# Patient Record
Sex: Female | Born: 1980 | Race: White | Hispanic: No | Marital: Married | Smoking: Current every day smoker
Health system: Southern US, Community
[De-identification: ages and names within clinical notes are randomized; demographics above are authoritative.]

## PROBLEM LIST (undated history)

## (undated) HISTORY — PX: DENTAL SURGERY: SHX609

---

## 2003-12-20 ENCOUNTER — Inpatient Hospital Stay: Payer: Self-pay | Admitting: Certified Nurse Midwife

## 2006-12-28 ENCOUNTER — Emergency Department: Payer: Self-pay | Admitting: Emergency Medicine

## 2007-07-08 ENCOUNTER — Inpatient Hospital Stay: Payer: Self-pay

## 2007-08-18 ENCOUNTER — Emergency Department: Payer: Self-pay | Admitting: Emergency Medicine

## 2008-12-14 ENCOUNTER — Emergency Department: Payer: Self-pay | Admitting: Emergency Medicine

## 2009-03-21 IMAGING — US US OB < 14 WEEKS - US OB TV
1 series · 17 of 28 positions shown · non-contrast
Comparison: none

REASON FOR EXAM: abd cramping, pain
COMMENTS:

PROCEDURE:     US  - US OB LESS THAN 14 WEEKS  - December 28, 2006 [DATE]
RESULT:     Comparison: No available comparison exam.

[Series 1: us ob < 14 weeks - us ob tv · 17 of 52 slices shown]
[im 1/52]
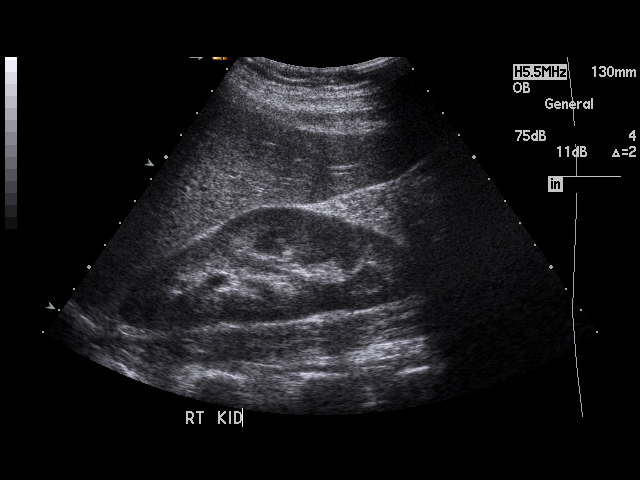
[im 4/52]
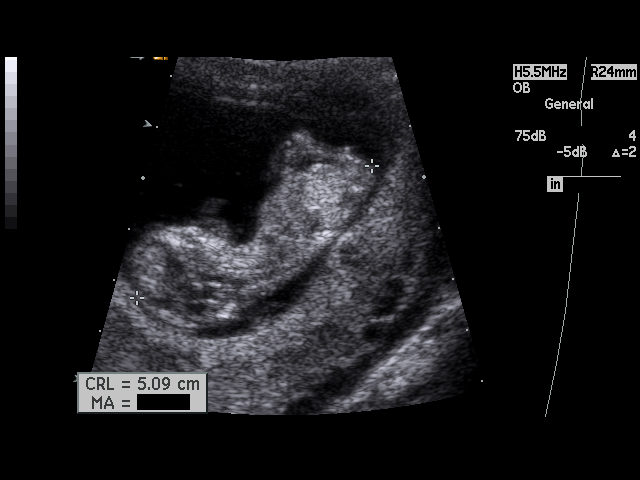
[im 8/52]
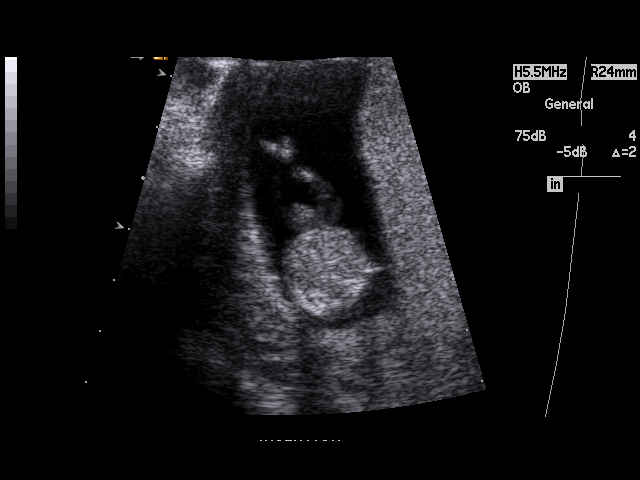
[im 10/52]
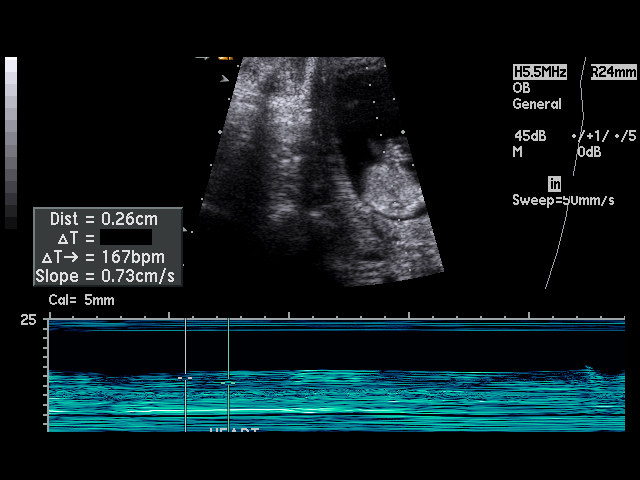
[im 14/52]
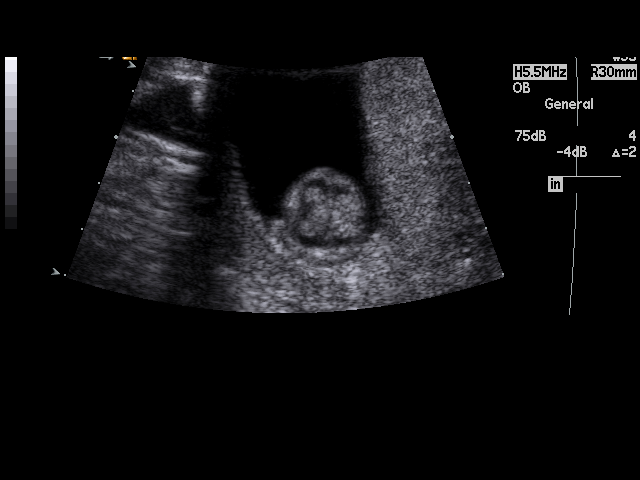
[im 18/52]
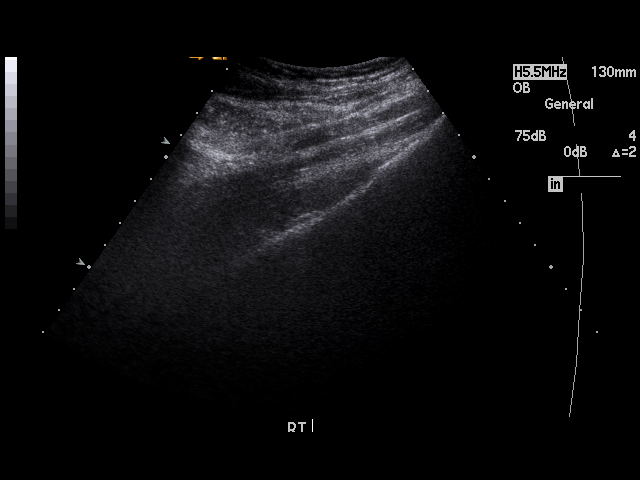
[im 19/52]
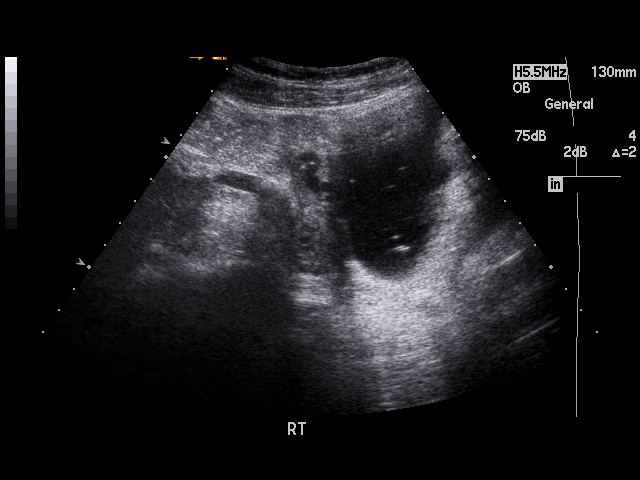
[im 23/52]
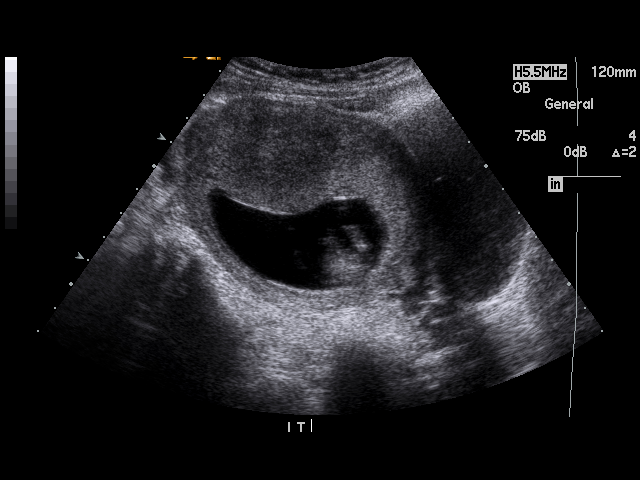
[im 27/52]
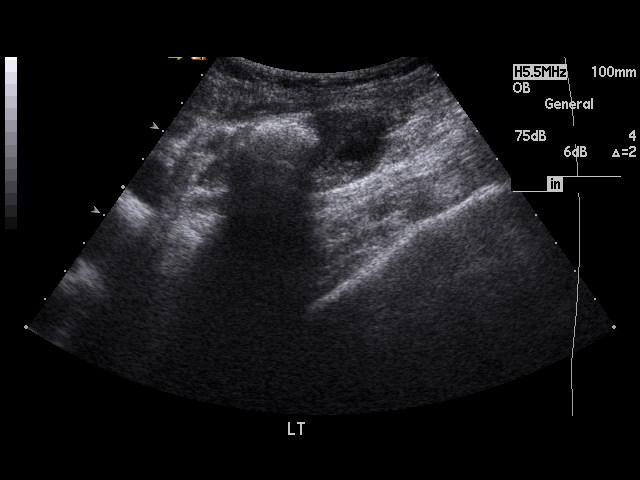
[im 29/52]
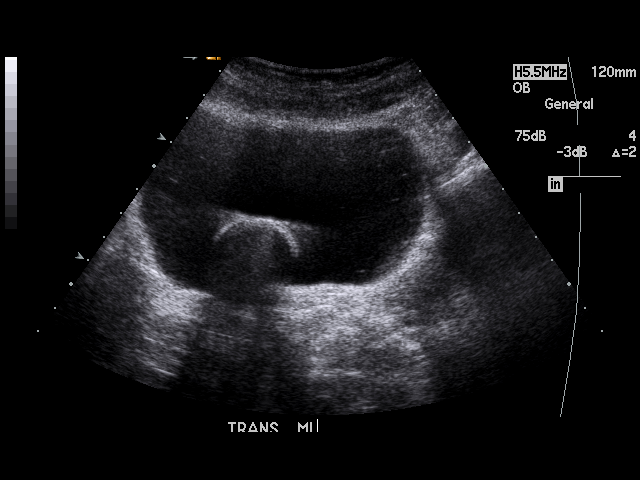
[im 33/52]
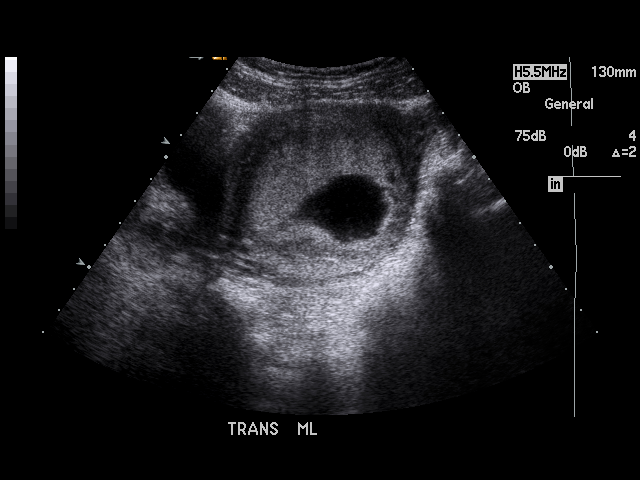
[im 35/52]
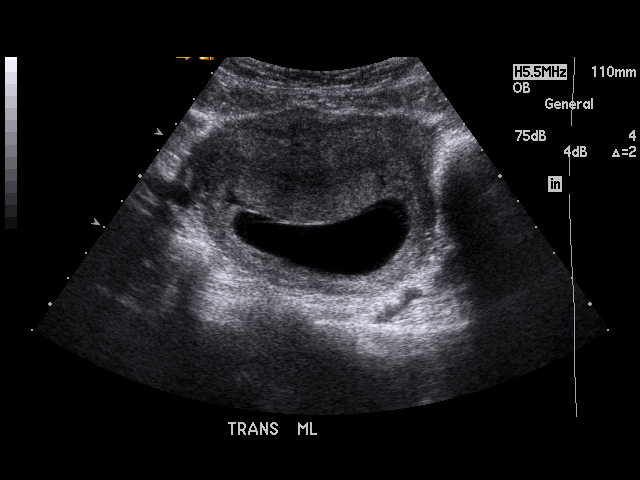
[im 38/52]
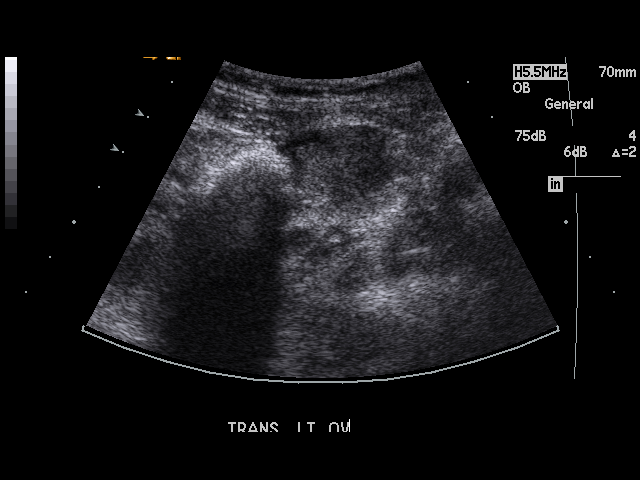
[im 42/52]
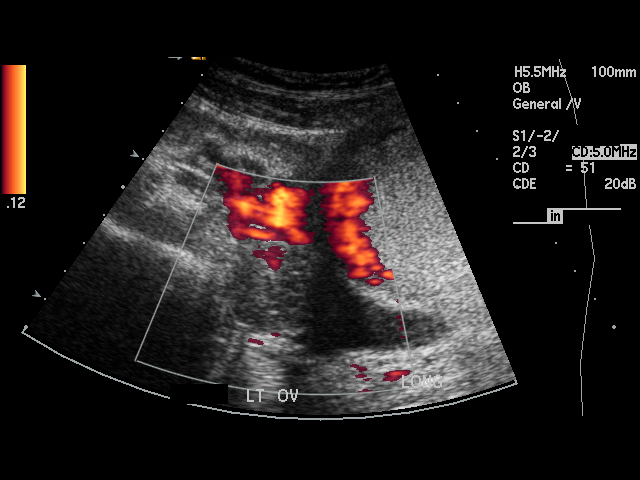
[im 44/52]
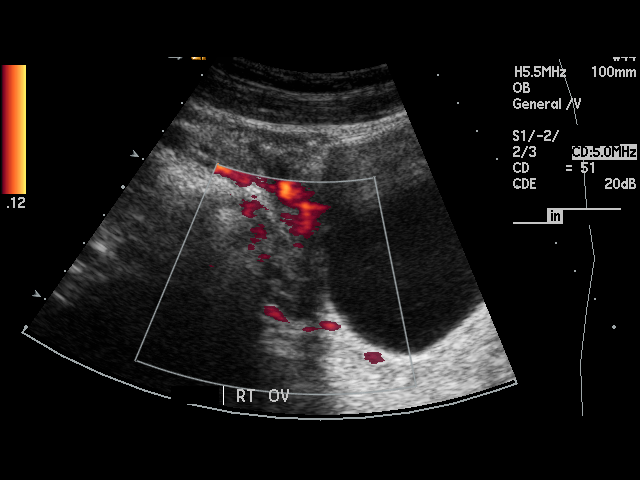
[im 48/52]
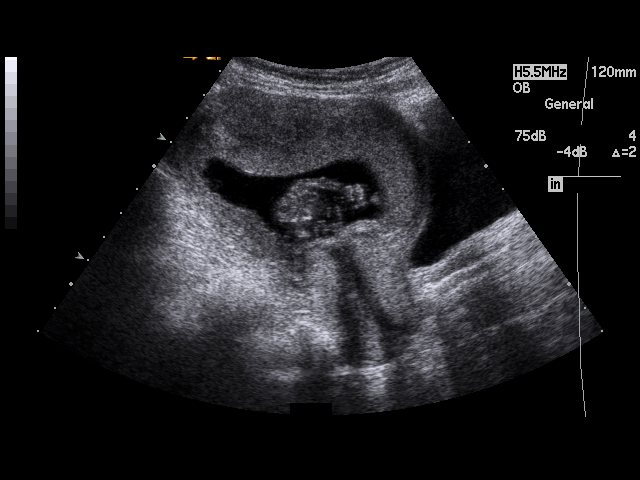
[im 52/52]
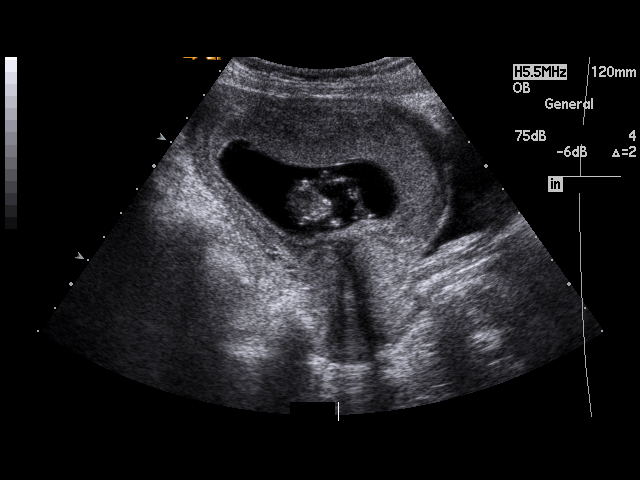

[17 of 28 positions shown; findings below may reference images not displayed]

FINDINGS: Transabdominal and transvaginal pelvic ultrasound is performed.

There is a single living intrauterine pregnancy. The placenta is anterior
without previa. The cervix is closed.

Embryonic heart motion, rate:____ 167 bpm.
Crown rump length:_____________ 5.0 cm, consistent with 11 weeks 5 days.

The ovaries are unremarkable. No significant pelvic free fluid is noted.
IMPRESSION: 1. Single living intrauterine pregnancy at 11 weeks 5 days estimated
gestational age.

Preliminary report was faxed to the emergency room by the night radiologist
shortly after the study was performed.

## 2009-05-06 ENCOUNTER — Emergency Department: Payer: Self-pay | Admitting: Emergency Medicine

## 2009-05-18 ENCOUNTER — Emergency Department: Payer: Self-pay | Admitting: Emergency Medicine

## 2009-06-17 ENCOUNTER — Ambulatory Visit: Payer: Self-pay | Admitting: Orthopedic Surgery

## 2009-10-19 ENCOUNTER — Ambulatory Visit: Payer: Self-pay | Admitting: Internal Medicine

## 2011-01-03 ENCOUNTER — Emergency Department: Payer: Self-pay | Admitting: Emergency Medicine

## 2011-01-23 ENCOUNTER — Emergency Department: Payer: Self-pay | Admitting: Emergency Medicine

## 2011-02-07 ENCOUNTER — Emergency Department: Payer: Self-pay | Admitting: Emergency Medicine

## 2011-02-19 ENCOUNTER — Emergency Department: Payer: Self-pay | Admitting: Emergency Medicine

## 2011-03-30 ENCOUNTER — Emergency Department: Payer: Self-pay | Admitting: Emergency Medicine

## 2020-11-27 ENCOUNTER — Ambulatory Visit
Admission: RE | Admit: 2020-11-27 | Discharge: 2020-11-27 | Disposition: A | Discharge status: Home / Self Care | Payer: Self-pay | Source: Ambulatory Visit | Attending: Student | Admitting: Student

## 2020-11-27 ENCOUNTER — Other Ambulatory Visit: Payer: Self-pay

## 2020-11-27 VITALS — BP 131/86 | HR 68 | Temp 98.3°F | Resp 16

## 2020-11-27 DIAGNOSIS — R21 Rash and other nonspecific skin eruption: Secondary | ICD-10-CM

## 2020-11-27 MED ORDER — MUPIROCIN CALCIUM 2 % EX CREA: 1.0000 "application " | TOPICAL_CREAM | Freq: Two times a day (BID) | CUTANEOUS | 1 refills | Status: AC

## 2020-11-27 MED ORDER — PREDNISONE 20 MG PO TABS: 20.0000 mg | ORAL_TABLET | Freq: Every day | ORAL | 0 refills | Status: AC

## 2020-11-27 NOTE — ED Provider Notes (Signed)
[facility]    CSN: [id number] Arrival date & time: 11/27/20  1056      History   Chief Complaint Chief Complaint  Patient presents with   Rash    HPI Brittney Ward is a 40 y.o. female presenting with rash over abdomen for about 1 week.  Medical history noncontributory.  Describes rash that initially started as few pustules but has worsened into a very itchy and painful rash that covers her right side.  Has tried over-the-counter hydrocortisone cream with minimal improvement.  States that sometimes when she scratches it a clear fluid will come out.  Denies history of this in the past.  Denies new products like detergent.  Denies outdoor exposure.  HPI  History reviewed. No pertinent past medical history.  There are no problems to display for this patient.   Past Surgical History:  Procedure Laterality Date   DENTAL SURGERY      OB History   No obstetric history on file.      Home Medications    Prior to Admission medications   Medication Sig Start Date End Date Taking? Authorizing Provider  mupirocin cream (BACTROBAN) 2 % Apply 1 application topically 2 (two) times daily for 7 days. 11/27/20 12/04/20 Yes Rhys Martini, PA-C  predniSONE (DELTASONE) 20 MG tablet Take 1 tablet (20 mg total) by mouth daily for 5 days. 11/27/20 12/02/20 Yes Rhys Martini, PA-C    Family History History reviewed. No pertinent family history.  Social History Social History   Tobacco Use   Smoking status: Every Day    Types: Cigarettes   Smokeless tobacco: Never     Allergies   Azithromycin   Review of Systems Review of Systems  Skin:  Positive for rash.  All other systems reviewed and are negative.   Physical Exam Triage Vital Signs ED Triage Vitals  Enc Vitals Group     BP 11/27/20 1129 131/86     Pulse Rate 11/27/20 1129 68     Resp 11/27/20 1129 16     Temp 11/27/20 1129 98.3 F (36.8 C)     Temp Source 11/27/20 1129 Oral     SpO2 11/27/20  1129 98 %     Weight --      Height --      Head Circumference --      Peak Flow --      Pain Score 11/27/20 1130 0     Pain Loc --      Pain Edu? --      Excl. in GC? --    No data found.  Updated Vital Signs BP 131/86 (BP Location: Left Arm)   Pulse 68   Temp 98.3 F (36.8 C) (Oral)   Resp 16   SpO2 98%   Visual Acuity Right Eye Distance:   Left Eye Distance:   Bilateral Distance:    Right Eye Near:   Left Eye Near:    Bilateral Near:     Physical Exam Vitals reviewed.  Constitutional:      General: She is not in acute distress.    Appearance: Normal appearance. She is not ill-appearing.  HENT:     Head: Normocephalic and atraumatic.  Pulmonary:     Effort: Pulmonary effort is normal.  Skin:    Comments: See image below R abdominal wall with multiple erythematous papules and few pustules. Minimal warmth. No induration or fluctuance. No discharge expressed.  Neurological:     General: No focal  deficit present.     Mental Status: She is alert and oriented to person, place, and time.  Psychiatric:        Mood and Affect: Mood normal.        Behavior: Behavior normal.        Thought Content: Thought content normal.        Judgment: Judgment normal.       UC Treatments / Results  Labs (all labs ordered are listed, but only abnormal results are displayed) Labs Reviewed - No data to display  EKG   Radiology No results found.  Procedures Procedures (including critical care time)  Medications Ordered in UC Medications - No data to display  Initial Impression / Assessment and Plan / UC Course  I have reviewed the triage vital signs and the nursing notes.  Pertinent labs & imaging results that were available during my care of the patient were reviewed by me and considered in my medical decision making (see chart for details).     This patient is a very pleasant 40 y.o. year old female presenting with rash. Suspect impetigo. Afebrile, nontachy. Will  manage with low-dose prednisone and mupirocin cream as below, with strict return precautions. .   Final Clinical Impressions(s) / UC Diagnoses   Final diagnoses:  Rash and nonspecific skin eruption     Discharge Instructions      -Prednisone one pill in the morning taken with food x5 days -Mupirocin cream twice daily for about 7 days -Benedryl for itching -Wash with gentle soap and water only -Follow-up if symptoms worsen/persist (redness increasing in size, worsening pain, new fevers/chills, etc)   ED Prescriptions     Medication Sig Dispense Auth. Provider   mupirocin cream (BACTROBAN) 2 % Apply 1 application topically 2 (two) times daily for 7 days. 15 g Rhys Martini, PA-C   predniSONE (DELTASONE) 20 MG tablet Take 1 tablet (20 mg total) by mouth daily for 5 days. 5 tablet Rhys Martini, PA-C      PDMP not reviewed this encounter.   Rhys Martini, PA-C 11/27/20 1154

## 2020-11-27 NOTE — Discharge Instructions (Addendum)
-  Prednisone one pill in the morning taken with food x5 days -Mupirocin cream twice daily for about 7 days -Benedryl for itching -Wash with gentle soap and water only -Follow-up if symptoms worsen/persist (redness increasing in size, worsening pain, new fevers/chills, etc)

## 2020-11-27 NOTE — ED Triage Notes (Signed)
Rash over right abdomin x 1 week. Itching, only hurts when scratched.

## 2021-04-10 ENCOUNTER — Other Ambulatory Visit: Payer: Self-pay

## 2021-04-10 ENCOUNTER — Ambulatory Visit (INDEPENDENT_AMBULATORY_CARE_PROVIDER_SITE_OTHER): Payer: BC Managed Care – PPO | Admitting: Family Medicine

## 2021-04-10 ENCOUNTER — Encounter: Payer: Self-pay | Admitting: Family Medicine

## 2021-04-10 VITALS — BP 105/68 | HR 71 | Temp 98.0°F | Resp 16 | Ht 66.0 in | Wt 135.4 lb

## 2021-04-10 DIAGNOSIS — Z7689 Persons encountering health services in other specified circumstances: Secondary | ICD-10-CM | POA: Diagnosis not present

## 2021-04-10 DIAGNOSIS — Z0000 Encounter for general adult medical examination without abnormal findings: Secondary | ICD-10-CM

## 2021-04-10 DIAGNOSIS — Z13220 Encounter for screening for lipoid disorders: Secondary | ICD-10-CM

## 2021-04-10 DIAGNOSIS — Z114 Encounter for screening for human immunodeficiency virus [HIV]: Secondary | ICD-10-CM

## 2021-04-10 DIAGNOSIS — Z1159 Encounter for screening for other viral diseases: Secondary | ICD-10-CM

## 2021-04-10 DIAGNOSIS — Z130 Encounter for screening for diseases of the blood and blood-forming organs and certain disorders involving the immune mechanism: Secondary | ICD-10-CM

## 2021-04-10 DIAGNOSIS — Z13228 Encounter for screening for other metabolic disorders: Secondary | ICD-10-CM

## 2021-04-10 DIAGNOSIS — Z1329 Encounter for screening for other suspected endocrine disorder: Secondary | ICD-10-CM

## 2021-04-10 NOTE — Progress Notes (Signed)
Patient has no concerns for provider today. Patient would just like to have a PCP incase she need one. ?

## 2021-04-11 ENCOUNTER — Other Ambulatory Visit: Payer: BLUE CROSS/BLUE SHIELD

## 2021-04-11 ENCOUNTER — Encounter: Payer: Self-pay | Admitting: Family Medicine

## 2021-04-11 NOTE — Progress Notes (Signed)
? ?New Patient Office Visit ? ?Subjective:  ?Patient ID: Brittney Ward, female    DOB: Jan 17, 1980  Age: 41 y.o. MRN: [id number] ? ?CC:  ?Chief Complaint  ?Patient presents with  ? Establish Care  ? ? ?HPI ?Brittney Ward presents for to establish care and for a routine annual exam. Patient denies acute complaints or concerns.  ? ?History reviewed. No pertinent past medical history. ? ?Past Surgical History:  ?Procedure Laterality Date  ? DENTAL SURGERY    ? ? ?Family History  ?Problem Relation Age of Onset  ? Cervical cancer Mother   ? Heart attack Father   ? Heart attack Maternal Grandfather   ? ? ?Social History  ? ?Socioeconomic History  ? Marital status: Married  ?  Spouse name: Not on file  ? Number of children: Not on file  ? Years of education: Not on file  ? Highest education level: Not on file  ?Occupational History  ? Not on file  ?Tobacco Use  ? Smoking status: Every Day  ?  Types: Cigarettes  ? Smokeless tobacco: Never  ?Substance and Sexual Activity  ? Alcohol use: Not Currently  ? Drug use: Not Currently  ? Sexual activity: Yes  ?Other Topics Concern  ? Not on file  ?Social History Narrative  ? Not on file  ? ?Social Determinants of Health  ? ?Financial Resource Strain: Not on file  ?Food Insecurity: Not on file  ?Transportation Needs: Not on file  ?Physical Activity: Not on file  ?Stress: Not on file  ?Social Connections: Not on file  ?Intimate Partner Violence: Not on file  ? ? ?ROS ?Review of Systems  ?All other systems reviewed and are negative. ? ?Objective:  ? ?Today's Vitals: BP 105/68   Pulse 71   Temp 98 ?F (36.7 ?C) (Oral)   Resp 16   Ht _0  (1.676 m)   Wt 135 lb 6.4 oz (61.4 kg)   SpO2 98%   BMI 21.85 kg/m?  ? ?Physical Exam ?Vitals and nursing note reviewed.  ?Constitutional:   ?   General: She is not in acute distress. ?HENT:  ?   Head: Normocephalic and atraumatic.  ?   Right Ear: Tympanic membrane, ear canal and external ear normal.  ?   Left Ear: Tympanic membrane,  ear canal and external ear normal.  ?   Nose: Nose normal.  ?   Mouth/Throat:  ?   Mouth: Mucous membranes are moist.  ?   Pharynx: Oropharynx is clear.  ?Eyes:  ?   Conjunctiva/sclera: Conjunctivae normal.  ?   Pupils: Pupils are equal, round, and reactive to light.  ?Neck:  ?   Thyroid: No thyromegaly.  ?Cardiovascular:  ?   Rate and Rhythm: Normal rate and regular rhythm.  ?   Heart sounds: Normal heart sounds. No murmur heard. ?Pulmonary:  ?   Effort: Pulmonary effort is normal. No respiratory distress.  ?   Breath sounds: Normal breath sounds.  ?Abdominal:  ?   General: There is no distension.  ?   Palpations: Abdomen is soft. There is no mass.  ?   Tenderness: There is no abdominal tenderness.  ?Musculoskeletal:     ?   General: Normal range of motion.  ?   Cervical back: Normal range of motion and neck supple.  ?Skin: ?   General: Skin is warm and dry.  ?Neurological:  ?   General: No focal deficit present.  ?   Mental Status: She  is alert and oriented to person, place, and time.  ?Psychiatric:     ?   Mood and Affect: Mood normal.     ?   Behavior: Behavior normal.  ? ? ?Assessment & Plan:  ? ?1. Annual physical exam ?Routine labs ordered ?- CMP14+EGFR ?- CBC with Differential ? ?2. Screening for deficiency anemia ? ?- CBC with Differential ? ?3. Screening for HIV (human immunodeficiency virus) ? ?- HIV antibody (with reflex) ? ?4. Need for hepatitis C screening test ? ?- Hepatitis C Antibody ? ?5. Screening for endocrine/metabolic/immunity disorders ? ?- TSH ? ?6. Screening for lipid disorders ? ?- Lipid Panel ? ?7. Encounter to establish care ? ? ? ? ?No outpatient encounter medications on file as of 04/10/2021.  ? ?No facility-administered encounter medications on file as of 04/10/2021.  ? ? ?Follow-up: No follow-ups on file.  ? ?Becky Sax, MD ? ?

## 2021-10-18 ENCOUNTER — Ambulatory Visit
Admission: EM | Admit: 2021-10-18 | Discharge: 2021-10-18 | Disposition: A | Discharge status: Home / Self Care | Payer: BC Managed Care – PPO | Attending: Physician Assistant | Admitting: Physician Assistant

## 2021-10-18 DIAGNOSIS — R21 Rash and other nonspecific skin eruption: Secondary | ICD-10-CM | POA: Diagnosis not present

## 2021-10-18 MED ORDER — METHYLPREDNISOLONE SODIUM SUCC 125 MG IJ SOLR
80.0000 mg | Freq: Once | INTRAMUSCULAR | Status: AC
  Administered 2021-10-18: 80 mg via INTRAMUSCULAR

## 2021-10-18 NOTE — ED Provider Notes (Signed)
[facility]    CSN: [id number] Arrival date & time: 10/18/21  1228      History   Chief Complaint Chief Complaint  Patient presents with   Rash    Entered by patient    HPI Tianni Escamilla is a 41 y.o. female.   Patient here today for evaluation of rash on her posterior neck that is been present for 2 days.  She states initially she noticed 2 small areas to her posterior lower scalp that she initially thought were insect bites however rash then spread down her neck.  Rash is not present where she would wear shirt or any further down her back.  Rash is not present anywhere else to body.  She is not sure of any exposure that might of caused symptoms.  She has not had any shortness of breath or trouble swallowing.  She has not had any treatment for symptoms.  The history is provided by the patient.  Rash Associated symptoms: no fever, no nausea, no shortness of breath and not vomiting     History reviewed. No pertinent past medical history.  There are no problems to display for this patient.   Past Surgical History:  Procedure Laterality Date   DENTAL SURGERY      OB History   No obstetric history on file.      Home Medications    Prior to Admission medications   Not on File    Family History Family History  Problem Relation Age of Onset   Cervical cancer Mother    Heart attack Father    Heart attack Maternal Grandfather     Social History Social History   Tobacco Use   Smoking status: Every Day    Types: Cigarettes   Smokeless tobacco: Never  Substance Use Topics   Alcohol use: Not Currently   Drug use: Not Currently     Allergies   Azithromycin   Review of Systems Review of Systems  Constitutional:  Negative for chills and fever.  HENT:  Negative for trouble swallowing.   Eyes:  Negative for discharge and redness.  Respiratory:  Negative for shortness of breath.   Gastrointestinal:  Negative for nausea and vomiting.   Skin:  Positive for rash.     Physical Exam Triage Vital Signs ED Triage Vitals  Enc Vitals Group     BP 10/18/21 1348 109/73     Pulse Rate 10/18/21 1348 71     Resp 10/18/21 1348 17     Temp 10/18/21 1348 97.7 F (36.5 C)     Temp Source 10/18/21 1348 Oral     SpO2 10/18/21 1348 98 %     Weight --      Height --      Head Circumference --      Peak Flow --      Pain Score 10/18/21 1347 0     Pain Loc --      Pain Edu? --      Excl. in [place]? --    No data found.  Updated Vital Signs BP 109/73 (BP Location: Right Arm)   Pulse 71   Temp 97.7 F (36.5 C) (Oral)   Resp 17   LMP 10/02/2021   SpO2 98%      Physical Exam Vitals and nursing note reviewed.  Constitutional:      General: She is not in acute distress.    Appearance: Normal appearance. She is not ill-appearing.  HENT:  Head: Normocephalic and atraumatic.  Eyes:     Conjunctiva/sclera: Conjunctivae normal.  Cardiovascular:     Rate and Rhythm: Normal rate.  Pulmonary:     Effort: Pulmonary effort is normal. No respiratory distress.  Skin:    Comments: Mildly erythematous uniform papular lesions noted to posterior neck.  Neurological:     Mental Status: She is alert.  Psychiatric:        Mood and Affect: Mood normal.        Behavior: Behavior normal.        Thought Content: Thought content normal.      UC Treatments / Results  Labs (all labs ordered are listed, but only abnormal results are displayed) Labs Reviewed - No data to display  EKG   Radiology No results found.  Procedures Procedures (including critical care time)  Medications Ordered in UC Medications  methylPREDNISolone sodium succinate (SOLU-MEDROL) 125 mg/2 mL injection 80 mg (80 mg Intramuscular Given 10/18/21 1413)    Initial Impression / Assessment and Plan / UC Course  I have reviewed the triage vital signs and the nursing notes.  Pertinent labs & imaging results that were available during my care of the patient  were reviewed by me and considered in my medical decision making (see chart for details).    Unknown cause of symptoms but seemingly some sort of contact dermatitis given significant number of lesions, less likely insect bites.  Recommended steroid treatment and patient prefers injection in office.  Encouraged follow-up if no gradual improvement or with any further concerns.  Final Clinical Impressions(s) / UC Diagnoses   Final diagnoses:  Rash and nonspecific skin eruption   Discharge Instructions   None    ED Prescriptions   None    PDMP not reviewed this encounter.   Tomi Bamberger, PA-C 10/18/21 1437

## 2021-10-18 NOTE — ED Triage Notes (Signed)
Pt presents with rash on back of neck X 2 days.

## 2022-06-18 ENCOUNTER — Ambulatory Visit
Admission: EM | Admit: 2022-06-18 | Discharge: 2022-06-18 | Disposition: A | Discharge status: Home / Self Care | Payer: BC Managed Care – PPO | Attending: Family Medicine | Admitting: Family Medicine

## 2022-06-18 DIAGNOSIS — L309 Dermatitis, unspecified: Secondary | ICD-10-CM | POA: Diagnosis not present

## 2022-06-18 MED ORDER — VALACYCLOVIR HCL 1 G PO TABS: 1000.0000 mg | ORAL_TABLET | Freq: Three times a day (TID) | ORAL | 0 refills | Status: AC

## 2022-06-18 MED ORDER — PREDNISONE 20 MG PO TABS: 40.0000 mg | ORAL_TABLET | Freq: Every day | ORAL | 0 refills | Status: AC

## 2022-06-18 NOTE — Discharge Instructions (Signed)
Take valacyclovir 1000 mg--1 tablet 3 times daily for 7 days; this is to treat possible shingles  Take prednisone 20 mg--2 daily for 5 days; this is to treat possible allergic rash

## 2022-06-18 NOTE — ED Triage Notes (Signed)
Pt is here for a 2 day rash on her right side.Pt reports burning and pain.

## 2022-06-18 NOTE — ED Provider Notes (Signed)
[facility]    CSN: [id number] Arrival date & time: 06/18/22  1652      History   Chief Complaint Chief Complaint  Patient presents with   Rash    HPI Brittney Ward is a 42 y.o. female.    Rash  Here for rash that is been itching and was first noted yesterday.  It is hurting there some, but it is mainly when things touch it.  No fever or chills no trouble breathing  She is allergic to azithromycin. Last menstrual cycle was May 19  History reviewed. No pertinent past medical history.  There are no problems to display for this patient.   Past Surgical History:  Procedure Laterality Date   DENTAL SURGERY      OB History   No obstetric history on file.      Home Medications    Prior to Admission medications   Medication Sig Start Date End Date Taking? Authorizing Provider  predniSONE (DELTASONE) 20 MG tablet Take 2 tablets (40 mg total) by mouth daily with breakfast for 5 days. 06/18/22 06/23/22 Yes Benney [place], Janace Aris, MD  valACYclovir (VALTREX) 1000 MG tablet Take 1 tablet (1,000 mg total) by mouth 3 (three) times daily for 7 days. 06/18/22 06/25/22 Yes Caprice Mccaffrey, Janace Aris, MD    Family History Family History  Problem Relation Age of Onset   Cervical cancer Mother    Heart attack Father    Heart attack Maternal Grandfather     Social History Social History   Tobacco Use   Smoking status: Every Day    Types: Cigarettes   Smokeless tobacco: Never  Substance Use Topics   Alcohol use: Not Currently   Drug use: Not Currently     Allergies   Azithromycin   Review of Systems Review of Systems  Skin:  Positive for rash.     Physical Exam Triage Vital Signs ED Triage Vitals [06/18/22 1700]  Enc Vitals Group     BP 137/84     Pulse Rate 91     Resp 18     Temp 98.2 F (36.8 C)     Temp Source Oral     SpO2 98 %     Weight      Height      Head Circumference      Peak Flow      Pain Score      Pain Loc      Pain Edu?       Excl. in GC?    No data found.  Updated Vital Signs BP 137/84 (BP Location: Left Arm)   Pulse 91   Temp 98.2 F (36.8 C) (Oral)   Resp 18   LMP 06/02/2022   SpO2 98%   Visual Acuity Right Eye Distance:   Left Eye Distance:   Bilateral Distance:    Right Eye Near:   Left Eye Near:    Bilateral Near:     Physical Exam Vitals reviewed.  Constitutional:      General: She is not in acute distress.    Appearance: She is not ill-appearing, toxic-appearing or diaphoretic.  HENT:     Mouth/Throat:     Mouth: Mucous membranes are moist.  Eyes:     Extraocular Movements: Extraocular movements intact.     Conjunctiva/sclera: Conjunctivae normal.     Pupils: Pupils are equal, round, and reactive to light.  Cardiovascular:     Rate and Rhythm: Normal rate and regular rhythm.  Pulmonary:     Effort: Pulmonary effort is normal.     Breath sounds: Normal breath sounds.  Skin:    Coloration: Skin is not pale.     Comments: There is AP bumpy rash in a circular distribution on her left side in her mid axillary area.  It is about 8 cm in diameter.  The individual bumpy parts may be coalescing into larger vesicles.  At present I cannot discern any vesicles    Neurological:     General: No focal deficit present.     Mental Status: She is alert and oriented to person, place, and time.  Psychiatric:        Behavior: Behavior normal.      UC Treatments / Results  Labs (all labs ordered are listed, but only abnormal results are displayed) Labs Reviewed - No data to display  EKG   Radiology No results found.  Procedures Procedures (including critical care time)  Medications Ordered in UC Medications - No data to display  Initial Impression / Assessment and Plan / UC Course  I have reviewed the triage vital signs and the nursing notes.  Pertinent labs & imaging results that were available during my care of the patient were reviewed by me and considered in my medical  decision making (see chart for details).        With her being a complaint of pain and rash possibly going to coalesce into vesicles, and treat for possible shingles.  This may still be a contact dermatitis.  Valtrex is sent in as his 5 days of prednisone.  She states ibuprofen has been helping. Final Clinical Impressions(s) / UC Diagnoses   Final diagnoses:  Dermatitis     Discharge Instructions      Take valacyclovir 1000 mg--1 tablet 3 times daily for 7 days; this is to treat possible shingles  Take prednisone 20 mg--2 daily for 5 days; this is to treat possible allergic rash       ED Prescriptions     Medication Sig Dispense Auth. Provider   predniSONE (DELTASONE) 20 MG tablet Take 2 tablets (40 mg total) by mouth daily with breakfast for 5 days. 10 tablet Zenia Resides, MD   valACYclovir (VALTREX) 1000 MG tablet Take 1 tablet (1,000 mg total) by mouth 3 (three) times daily for 7 days. 21 tablet Romain Erion, Janace Aris, MD      PDMP not reviewed this encounter.   Zenia Resides, MD 06/18/22 [phone]

## 2022-10-19 ENCOUNTER — Other Ambulatory Visit: Payer: Self-pay

## 2022-10-19 ENCOUNTER — Encounter: Payer: Self-pay | Admitting: *Deleted

## 2022-10-19 ENCOUNTER — Ambulatory Visit
Admission: EM | Admit: 2022-10-19 | Discharge: 2022-10-19 | Disposition: A | Discharge status: Home / Self Care | Payer: BC Managed Care – PPO | Attending: Physician Assistant | Admitting: Physician Assistant

## 2022-10-19 DIAGNOSIS — R234 Changes in skin texture: Secondary | ICD-10-CM | POA: Diagnosis not present

## 2022-10-19 MED ORDER — SILVER SULFADIAZINE 1 % EX CREA
TOPICAL_CREAM | Freq: Once | CUTANEOUS | Status: AC
  Administered 2022-10-19: 1 via TOPICAL

## 2022-10-19 MED ORDER — SILVER SULFADIAZINE 1 % EX CREA: 1.0000 | TOPICAL_CREAM | Freq: Every day | CUTANEOUS | 0 refills | Status: DC

## 2022-10-19 NOTE — ED Provider Notes (Signed)
[facility]    CSN: [id number] Arrival date & time: 10/19/22  1243      History   Chief Complaint Chief Complaint  Patient presents with   Rash    HPI Arleny Kruger is a 42 y.o. female.   The history is provided by the patient.  Rash Associated symptoms: no abdominal pain, no fever, no nausea, no shortness of breath and not vomiting     History reviewed. No pertinent past medical history.  There are no problems to display for this patient.   Past Surgical History:  Procedure Laterality Date   DENTAL SURGERY      OB History   No obstetric history on file.      Home Medications    Prior to Admission medications   Not on File    Family History Family History  Problem Relation Age of Onset   Cervical cancer Mother    Heart attack Father    Heart attack Maternal Grandfather     Social History Social History   Tobacco Use   Smoking status: Every Day    Types: Cigarettes   Smokeless tobacco: Never  Substance Use Topics   Alcohol use: Not Currently   Drug use: Not Currently     Allergies   Azithromycin   Review of Systems Review of Systems  Constitutional:  Negative for chills and fever.  Eyes:  Negative for discharge and redness.  Respiratory:  Negative for shortness of breath.   Gastrointestinal:  Negative for abdominal pain, nausea and vomiting.  Skin:  Positive for rash.     Physical Exam Triage Vital Signs ED Triage Vitals  Encounter Vitals Group     BP 10/19/22 1342 116/81     Systolic BP Percentile --      Diastolic BP Percentile --      Pulse Rate 10/19/22 1342 68     Resp 10/19/22 1342 16     Temp 10/19/22 1342 97.9 F (36.6 C)     Temp Source 10/19/22 1342 Oral     SpO2 10/19/22 1342 98 %     Weight --      Height --      Head Circumference --      Peak Flow --      Pain Score 10/19/22 1341 4     Pain Loc --      Pain Education --      Exclude from Growth Chart --    No data found.  Updated  Vital Signs BP 116/81 (BP Location: Left Arm)   Pulse 68   Temp 97.9 F (36.6 C) (Oral)   Resp 16   LMP 09/30/2022 (Approximate)   SpO2 98%      Physical Exam Vitals and nursing note reviewed.  Constitutional:      General: She is not in acute distress.    Appearance: Normal appearance. She is not ill-appearing.  HENT:     Head: Normocephalic and atraumatic.  Eyes:     Conjunctiva/sclera: Conjunctivae normal.  Cardiovascular:     Rate and Rhythm: Normal rate.  Pulmonary:     Effort: Pulmonary effort is normal.  Neurological:     Mental Status: She is alert.  Psychiatric:        Mood and Affect: Mood normal.        Behavior: Behavior normal.        Thought Content: Thought content normal.      UC Treatments / Results  Labs (all labs ordered are listed, but only abnormal results are displayed) Labs Reviewed - No data to display  EKG   Radiology No results found.  Procedures Procedures (including critical care time)  Medications Ordered in UC Medications - No data to display  Initial Impression / Assessment and Plan / UC Course  I have reviewed the triage vital signs and the nursing notes.  Pertinent labs & imaging results that were available during my care of the patient were reviewed by me and considered in my medical decision making (see chart for details).     *** Final Clinical Impressions(s) / UC Diagnoses   Final diagnoses:  None   Discharge Instructions   None    ED Prescriptions   None    PDMP not reviewed this encounter.

## 2022-10-19 NOTE — ED Triage Notes (Signed)
Painful rash to right hand x 4 days

## 2022-10-22 ENCOUNTER — Encounter: Payer: Self-pay | Admitting: Physician Assistant

## 2022-12-20 ENCOUNTER — Ambulatory Visit
Admission: EM | Admit: 2022-12-20 | Discharge: 2022-12-20 | Disposition: A | Discharge status: Home / Self Care | Payer: BC Managed Care – PPO

## 2022-12-20 DIAGNOSIS — L301 Dyshidrosis [pompholyx]: Secondary | ICD-10-CM

## 2022-12-20 DIAGNOSIS — L03011 Cellulitis of right finger: Secondary | ICD-10-CM

## 2022-12-20 MED ORDER — CEPHALEXIN 500 MG PO CAPS: 500.0000 mg | ORAL_CAPSULE | Freq: Four times a day (QID) | ORAL | 0 refills | Status: AC

## 2022-12-20 MED ORDER — TRIAMCINOLONE ACETONIDE 0.1 % EX CREA: 1.0000 | TOPICAL_CREAM | Freq: Two times a day (BID) | CUTANEOUS | 0 refills | Status: AC

## 2022-12-20 NOTE — ED Provider Notes (Signed)
[facility]    CSN: [id number] Arrival date & time: 12/20/22  1409      History   Chief Complaint Chief Complaint  Patient presents with   Skin Problem    HPI Brittney Ward is a 42 y.o. female.   Patient presents today with a reoccurring rash and bumps to bilateral hands and fingers.  Patient states that she was seen here before and was given a Silvadene cream that helped very minimally.  She works at [employer] and washes her hands very frequently.  The right thumb seems to have redness and draining.  Denies any change in soaps or detergents.  No fevers no nausea vomiting diarrhea    History reviewed. No pertinent past medical history.  There are no problems to display for this patient.   Past Surgical History:  Procedure Laterality Date   DENTAL SURGERY      OB History   No obstetric history on file.      Home Medications    Prior to Admission medications   Medication Sig Start Date End Date Taking? Authorizing Provider  cephALEXin (KEFLEX) 500 MG capsule Take 1 capsule (500 mg total) by mouth 4 (four) times daily. 12/20/22  Yes Coralyn Mark, NP  ibuprofen (ADVIL) 200 MG tablet Take 200 mg by mouth every 6 (six) hours as needed.   Yes [provider]  triamcinolone cream (KENALOG) 0.1 % Apply 1 Application topically 2 (two) times daily. 12/20/22  Yes Coralyn Mark, NP    Family History Family History  Problem Relation Age of Onset   Cervical cancer Mother    Heart attack Father    Heart attack Maternal Grandfather     Social History Social History   Tobacco Use   Smoking status: Every Day    Types: Cigarettes   Smokeless tobacco: Never  Vaping Use   Vaping status: Never Used  Substance Use Topics   Alcohol use: Not Currently   Drug use: Not Currently     Allergies   Azithromycin   Review of Systems Review of Systems  Constitutional: Negative.   Respiratory: Negative.    Cardiovascular: Negative.    Skin:  Positive for rash.       Small bumps to bilateral right and left fingers.  Right thumb has redness and slight drainage     Physical Exam Triage Vital Signs ED Triage Vitals  Encounter Vitals Group     BP 12/20/22 1416 121/78     Systolic BP Percentile --      Diastolic BP Percentile --      Pulse Rate 12/20/22 1416 77     Resp 12/20/22 1416 18     Temp 12/20/22 1416 97.9 F (36.6 C)     Temp Source 12/20/22 1416 Oral     SpO2 12/20/22 1416 96 %     Weight 12/20/22 1414 125 lb (56.7 kg)     Height 12/20/22 1414 5\' 6"  (1.676 m)     Head Circumference --      Peak Flow --      Pain Score 12/20/22 1412 0     Pain Loc --      Pain Education --      Exclude from Growth Chart --    No data found.  Updated Vital Signs BP 121/78 (BP Location: Left Arm)   Pulse 77   Temp 97.9 F (36.6 C) (Oral)   Resp 18   Ht 5\' 6"  (1.676  m)   Wt 125 lb (56.7 kg)   LMP 12/19/2022 (Exact Date)   SpO2 96%   BMI 20.18 kg/m   Visual Acuity Right Eye Distance:   Left Eye Distance:   Bilateral Distance:    Right Eye Near:   Left Eye Near:    Bilateral Near:     Physical Exam Constitutional:      Appearance: Normal appearance.  Cardiovascular:     Rate and Rhythm: Normal rate.  Pulmonary:     Effort: Pulmonary effort is normal.  Abdominal:     General: Abdomen is flat.  Skin:    Findings: Erythema and rash present.     Comments: Erythema with clear drainage noted to right thumb multiple areas of small raised bumps on multiple digits on bilateral hands  Neurological:     Mental Status: She is alert.      UC Treatments / Results  Labs (all labs ordered are listed, but only abnormal results are displayed) Labs Reviewed - No data to display  EKG   Radiology No results found.  Procedures Procedures (including critical care time)  Medications Ordered in UC Medications - No data to display  Initial Impression / Assessment and Plan / UC Course  I have reviewed  the triage vital signs and the nursing notes.  Pertinent labs & imaging results that were available during my care of the patient were reviewed by me and considered in my medical decision making (see chart for details).     Apply steroid cream twice a day for the next 7 to 14 days keep open areas covered. Follow-up with dermatology Use a heavy lotion cream or A&E ointment to bilateral hands and keep covered throughout the day or in the evenings Take full dose of antibiotics to prevent any further cellulitis skin infection Final Clinical Impressions(s) / UC Diagnoses   Final diagnoses:  Cellulitis of finger of right hand  Dyshidrotic eczema     Discharge Instructions      Apply steroid cream twice a day for the next 7 to 14 days keep open areas covered. Follow-up with dermatology Use a heavy lotion cream or A&E ointment to bilateral hands and keep covered throughout the day or in the evenings Take full dose of antibiotics to prevent any further cellulitis skin infection     ED Prescriptions     Medication Sig Dispense Auth. Provider   cephALEXin (KEFLEX) 500 MG capsule Take 1 capsule (500 mg total) by mouth 4 (four) times daily. 20 capsule Maple Mirza L, NP   triamcinolone cream (KENALOG) 0.1 % Apply 1 Application topically 2 (two) times daily. 30 g Coralyn Mark, NP      PDMP not reviewed this encounter.   Coralyn Mark, NP 12/20/22 1441

## 2022-12-20 NOTE — Discharge Instructions (Addendum)
Apply steroid cream twice a day for the next 7 to 14 days keep open areas covered. Follow-up with dermatology Use a heavy lotion cream or A&E ointment to bilateral hands and keep covered throughout the day or in the evenings Take full dose of antibiotics to prevent any further cellulitis skin infection

## 2022-12-20 NOTE — ED Triage Notes (Signed)
"  I have been in here before regarding my thumb where the skin is peeling off, it always starts with a few bumps on the end of it and then skin peeling off". "Now this is back again on my right thumb and neck (right/left) just below ears". "I have noticed some on the left as well". No fever.
